# Patient Record
Sex: Male | Born: 1985 | Marital: Married
Health system: Southern US, Community
[De-identification: ages and names within clinical notes are randomized; demographics above are authoritative.]

---

## 2014-11-05 ENCOUNTER — Ambulatory Visit: Payer: 59 | Admitting: Podiatry

## 2014-11-26 ENCOUNTER — Ambulatory Visit: Payer: 59 | Admitting: Podiatry

## 2020-01-20 ENCOUNTER — Emergency Department (HOSPITAL_COMMUNITY)
Admission: EM | Admit: 2020-01-20 | Discharge: 2020-01-20 | Disposition: A | Discharge status: Home / Self Care | Payer: Medicaid Other | Attending: Emergency Medicine | Admitting: Emergency Medicine

## 2020-01-20 ENCOUNTER — Other Ambulatory Visit: Payer: Self-pay

## 2020-01-20 ENCOUNTER — Emergency Department (HOSPITAL_COMMUNITY): Payer: Medicaid Other

## 2020-01-20 DIAGNOSIS — M7918 Myalgia, other site: Secondary | ICD-10-CM

## 2020-01-20 DIAGNOSIS — T148XXA Other injury of unspecified body region, initial encounter: Secondary | ICD-10-CM

## 2020-01-20 DIAGNOSIS — S8991XA Unspecified injury of right lower leg, initial encounter: Secondary | ICD-10-CM | POA: Diagnosis present

## 2020-01-20 DIAGNOSIS — S80811A Abrasion, right lower leg, initial encounter: Secondary | ICD-10-CM | POA: Insufficient documentation

## 2020-01-20 DIAGNOSIS — M542 Cervicalgia: Secondary | ICD-10-CM | POA: Insufficient documentation

## 2020-01-20 DIAGNOSIS — Y999 Unspecified external cause status: Secondary | ICD-10-CM | POA: Diagnosis not present

## 2020-01-20 DIAGNOSIS — Y9389 Activity, other specified: Secondary | ICD-10-CM | POA: Diagnosis not present

## 2020-01-20 DIAGNOSIS — M545 Low back pain: Secondary | ICD-10-CM | POA: Insufficient documentation

## 2020-01-20 DIAGNOSIS — Y92410 Unspecified street and highway as the place of occurrence of the external cause: Secondary | ICD-10-CM | POA: Insufficient documentation

## 2020-01-20 MED ORDER — METHOCARBAMOL 500 MG PO TABS: 500.0000 mg | ORAL_TABLET | Freq: Three times a day (TID) | ORAL | 0 refills | Status: AC

## 2020-01-20 MED ORDER — LIDOCAINE 5 % EX PTCH
1.0000 | MEDICATED_PATCH | CUTANEOUS | Status: DC
  Administered 2020-01-20: 1 via TRANSDERMAL
  Filled 2020-01-20: qty 1

## 2020-01-20 MED ORDER — METHOCARBAMOL 500 MG PO TABS
500.0000 mg | ORAL_TABLET | Freq: Once | ORAL | Status: AC
  Administered 2020-01-20: 500 mg via ORAL
  Filled 2020-01-20: qty 1

## 2020-01-20 MED ORDER — KETOROLAC TROMETHAMINE 15 MG/ML IJ SOLN
15.0000 mg | Freq: Once | INTRAMUSCULAR | Status: DC
  Filled 2020-01-20: qty 1

## 2020-01-20 MED ORDER — ACETAMINOPHEN 325 MG PO TABS
650.0000 mg | ORAL_TABLET | Freq: Once | ORAL | Status: AC
  Administered 2020-01-20: 650 mg via ORAL
  Filled 2020-01-20: qty 2
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
## 2020-01-20 NOTE — Discharge Instructions (Signed)
At this time there does not appear to be the presence of an emergent medical condition, however there is always the potential for conditions to change. Please read and follow the below instructions.  Please return to the Emergency Department immediately for any new or worsening symptoms. Please be sure to follow up with your Primary Care Provider within one week regarding your visit today; please call their office to schedule an appointment even if you are feeling better for a follow-up visit. You may use the muscle relaxer Robaxin as prescribed to help with your symptoms.  Do not drive or operate heavy machinery while taking Robaxin as it will make you drowsy.  Do not drink alcohol or take other sedating medications while taking Robaxin as this will worsen side effects. You may use the Lidoderm patch as prescribed to help with your symptoms.  Lidoderm may be expensive so you may speak with your pharmacist about finding over-the-counter medications that work similarly.  Salon patches are over-the-counter turning of that you can culture the pharmacist to help. Keep your abrasions clean and covered with sterile gauze.  Monitor for signs of infection and return to the emergency department if you develop any signs of infection.  Get help right away if: You have: Loss of feeling (numbness), tingling, or weakness in your arms or legs. Very bad neck pain, especially tenderness in the middle of the back of your neck. A change in your ability to control your pee or poop (stool). More pain in any area of your body. Swelling in any area of your body, especially your legs. Shortness of breath or light-headedness. Chest pain. Blood in your pee, poop, or vomit. Very bad pain in your belly (abdomen) or your back. Very bad headaches or headaches that are getting worse. Sudden vision loss or double vision. Your eye suddenly turns red. The black center of your eye (pupil) is an odd shape or size. You have a red  streak going away from your wound. You have a fever. You have fluid, blood, or pus coming from your wound. There is a bad smell coming from your wound or bandage. You have any new/concerning or worsening of symptoms  Please read the additional information packets attached to your discharge summary.  Do not take your medicine if  develop an itchy rash, swelling in your mouth or lips, or difficulty breathing; call 911 and seek immediate emergency medical attention if this occurs.  You may review your lab tests and imaging results in their entirety on your MyChart account.  Please discuss all results of fully with your primary care provider and other specialist at your follow-up visit.  Note: Portions of this text may have been transcribed using voice recognition software. Every effort was made to ensure accuracy; however, inadvertent computerized transcription errors may still be present.
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
